# Patient Record
Sex: Female | Born: 1990 | Race: White | Hispanic: No | Marital: Single | Smoking: Never smoker
Health system: Southern US, Community
[De-identification: ages and names within clinical notes are randomized; demographics above are authoritative.]

## PROBLEM LIST (undated history)

## (undated) DIAGNOSIS — F329 Major depressive disorder, single episode, unspecified: Secondary | ICD-10-CM

## (undated) DIAGNOSIS — J45909 Unspecified asthma, uncomplicated: Secondary | ICD-10-CM

## (undated) DIAGNOSIS — F32A Depression, unspecified: Secondary | ICD-10-CM

## (undated) HISTORY — PX: NO PAST SURGERIES: SHX2092

---

## 2015-12-31 ENCOUNTER — Observation Stay
Admission: EM | Admit: 2015-12-31 | Discharge: 2016-01-02 | Disposition: A | Discharge status: Home / Self Care | Payer: BLUE CROSS/BLUE SHIELD | Attending: Internal Medicine | Admitting: Internal Medicine

## 2015-12-31 ENCOUNTER — Emergency Department: Payer: BLUE CROSS/BLUE SHIELD

## 2015-12-31 DIAGNOSIS — F329 Major depressive disorder, single episode, unspecified: Secondary | ICD-10-CM | POA: Diagnosis not present

## 2015-12-31 DIAGNOSIS — Z79899 Other long term (current) drug therapy: Secondary | ICD-10-CM | POA: Diagnosis not present

## 2015-12-31 DIAGNOSIS — J40 Bronchitis, not specified as acute or chronic: Secondary | ICD-10-CM | POA: Diagnosis present

## 2015-12-31 DIAGNOSIS — J45909 Unspecified asthma, uncomplicated: Secondary | ICD-10-CM | POA: Diagnosis not present

## 2015-12-31 DIAGNOSIS — J069 Acute upper respiratory infection, unspecified: Secondary | ICD-10-CM | POA: Diagnosis present

## 2015-12-31 DIAGNOSIS — J209 Acute bronchitis, unspecified: Secondary | ICD-10-CM | POA: Diagnosis not present

## 2015-12-31 DIAGNOSIS — E872 Acidosis, unspecified: Secondary | ICD-10-CM

## 2015-12-31 DIAGNOSIS — F32A Depression, unspecified: Secondary | ICD-10-CM | POA: Diagnosis present

## 2015-12-31 HISTORY — DX: Depression, unspecified: F32.A

## 2015-12-31 HISTORY — DX: Unspecified asthma, uncomplicated: J45.909

## 2015-12-31 HISTORY — DX: Major depressive disorder, single episode, unspecified: F32.9

## 2015-12-31 LAB — CBC
HCT: 43.1 % (ref 35.0–47.0)
HEMOGLOBIN: 15 g/dL (ref 12.0–16.0)
MCH: 32.2 pg (ref 26.0–34.0)
MCHC: 34.8 g/dL (ref 32.0–36.0)
MCV: 92.6 fL (ref 80.0–100.0)
PLATELETS: 214 10*3/uL (ref 150–440)
RBC: 4.66 MIL/uL (ref 3.80–5.20)
RDW: 13 % (ref 11.5–14.5)
WBC: 8 10*3/uL (ref 3.6–11.0)

## 2015-12-31 LAB — BASIC METABOLIC PANEL
ANION GAP: 10 (ref 5–15)
BUN: 7 mg/dL (ref 6–20)
CHLORIDE: 111 mmol/L (ref 101–111)
CO2: 19 mmol/L — ABNORMAL LOW (ref 22–32)
Calcium: 8.5 mg/dL — ABNORMAL LOW (ref 8.9–10.3)
Creatinine, Ser: 0.64 mg/dL (ref 0.44–1.00)
GFR calc Af Amer: >60 mL/min (ref >60)
GLUCOSE: 93 mg/dL (ref 65–99)
POTASSIUM: 3.9 mmol/L (ref 3.5–5.1)
Sodium: 140 mmol/L (ref 135–145)

## 2015-12-31 LAB — RAPID INFLUENZA A&B ANTIGENS (ARMC ONLY): INFLUENZA A (ARMC): NOT DETECTED

## 2015-12-31 LAB — RAPID INFLUENZA A&B ANTIGENS: Influenza B (ARMC): NOT DETECTED

## 2015-12-31 LAB — LACTIC ACID, PLASMA: Lactic Acid, Venous: 1.7 mmol/L (ref 0.5–2.0)

## 2015-12-31 MED ORDER — SODIUM CHLORIDE 0.9 % IV BOLUS (SEPSIS)
1000.0000 mL | Freq: Once | INTRAVENOUS | Status: AC
  Administered 2015-12-31: 1000 mL via INTRAVENOUS

## 2015-12-31 MED ORDER — METHYLPREDNISOLONE SODIUM SUCC 125 MG IJ SOLR
INTRAMUSCULAR | Status: AC
Start: 2015-12-31 — End: 2015-12-31
  Administered 2015-12-31: 125 mg
  Filled 2015-12-31: qty 2

## 2015-12-31 MED ORDER — ALBUTEROL SULFATE (2.5 MG/3ML) 0.083% IN NEBU
INHALATION_SOLUTION | RESPIRATORY_TRACT | Status: AC
  Administered 2015-12-31: 2.5 mg
  Filled 2015-12-31: qty 3

## 2015-12-31 MED ORDER — MAGNESIUM SULFATE 2 GM/50ML IV SOLN
2.0000 g | Freq: Once | INTRAVENOUS | Status: AC
  Administered 2016-01-01: 2 g via INTRAVENOUS
  Filled 2015-12-31: qty 50

## 2015-12-31 MED ORDER — IPRATROPIUM-ALBUTEROL 0.5-2.5 (3) MG/3ML IN SOLN
3.0000 mL | Freq: Once | RESPIRATORY_TRACT | Status: AC
  Administered 2015-12-31: 3 mL via RESPIRATORY_TRACT
  Filled 2015-12-31: qty 3

## 2015-12-31 MED ORDER — ALBUTEROL SULFATE (2.5 MG/3ML) 0.083% IN NEBU
7.5000 mg/h | INHALATION_SOLUTION | RESPIRATORY_TRACT | Status: AC
  Administered 2015-12-31: 7.5 mg/h via RESPIRATORY_TRACT
  Filled 2015-12-31 (×2): qty 3

## 2015-12-31 NOTE — ED Provider Notes (Signed)
[facility] Emergency Department Provider Note  ____________________________________________  Time seen: Approximately 11:12 PM  I have reviewed the triage vital signs and the nursing notes.   HISTORY  Chief Complaint Cough    HPI Leslie Price is a 25 y.o. female reports no previous medical history.  Patient reports that she started developing cough cold and runny nose yesterday. She had some slight wheezing, and then this evening had felt like she ate a "inhaler" and her wheezing continued to worsen. She feels short of breath, notices a tightening across her chest as though she is having trouble breathing.  Denies abdominal pain. No sharp pain. Reports feeling very short of breath and anxious. She does not have any fever that she is aware of it is felt like she's been running a fever.  She did not receive influenza vaccine this year.   No past medical history on file.  There are no active problems to display for this patient.   No past surgical history on file.  Current Outpatient Rx  Name  Route  Sig  Dispense  Refill  . escitalopram (LEXAPRO) 10 MG tablet   Oral   Take 10 mg by mouth daily.           Allergies Review of patient's allergies indicates no known allergies.  No family history on file.  Social History Social History  Substance Use Topics  . Smoking status: Not on file  . Smokeless tobacco: Not on file  . Alcohol Use: Not on file   Denies smokeless tobacco use Denies alcohol use tonight  Review of Systems Constitutional: Feeling warm and chills Eyes: No visual changes. ENT: No sore throat. Cardiovascular: Denies chest pain feels tight. Respiratory: See history of present illness Gastrointestinal: No abdominal pain.  No nausea, no vomiting.  No diarrhea.  No constipation. Genitourinary: Negative for dysuria. Musculoskeletal: Negative for back pain. Skin: Negative for rash. Neurological: Negative for headaches,  focal weakness or numbness.  10-point ROS otherwise negative.  ____________________________________________   PHYSICAL EXAM:  VITAL SIGNS: ED Triage Vitals  Enc Vitals Group     BP 12/31/15 2214 120/87 mmHg     Pulse Rate 12/31/15 2214 127     Resp 12/31/15 2214 24     Temp 12/31/15 2214 98.5 F (36.9 C)     Temp Source 12/31/15 2214 Oral     SpO2 12/31/15 2214 98 %     Weight 12/31/15 2214 140 lb (63.504 kg)     Height 12/31/15 2214 5\' 1"  (1.549 m)     Head Cir --      Peak Flow --      Pain Score 12/31/15 2215 8     Pain Loc --      Pain Edu? --      Excl. in GC? --    Constitutional: Alert and oriented and moderately anxious. Diaphoretic Eyes: Conjunctivae are normal. PERRL. EOMI. Head: Atraumatic. Nose: No congestion/rhinnorhea. Mouth/Throat: Mucous membranes are moist.  Neck: No stridor.   Cardiovascular: Tachycardic rate, regular rhythm. Grossly normal heart sounds.  Good peripheral circulation. Respiratory: Moderate increased work of breathing, diffuse end expiratory wheezing and frequent dry cough are noted. Patient is able to speak in phrases, but does not form. Sentences and does appear dyspneic. No rales are noted. No rhonchi. Gastrointestinal: Soft and nontender. No distention. Patient denies pregnancy. Abdomen does not appear gravid Musculoskeletal: No lower extremity tenderness nor edema.  No joint effusions. Neurologic:  Normal speech and language.  No gross focal neurologic deficits are appreciated. Skin:  Skin is warm, dry and intact. No rash noted. Psychiatric: Mood and affect are slightly anxious.  ____________________________________________   LABS (all labs ordered are listed, but only abnormal results are displayed)  Labs Reviewed  BLOOD GAS, VENOUS - Abnormal; Notable for the following:    pH, Ven 7.50 (*)    pCO2, Ven 26 (*)    Bicarbonate 20.3 (*)    All other components within normal limits  BASIC METABOLIC PANEL - Abnormal; Notable for the  following:    CO2 19 (*)    Calcium 8.5 (*)    All other components within normal limits  RAPID INFLUENZA A&B ANTIGENS ([facility] ONLY)  LACTIC ACID, PLASMA  CBC  LACTIC ACID, PLASMA  HCG, QUANTITATIVE, PREGNANCY   ____________________________________________  EKG  Reviewed and interpreted by me at 2230 Heart rate 1:30 QRS 100 QTc 440 PR 140 Sinus tachycardia, rate approximately 1:30. Mild artifact is noted, however no acute ischemic abnormality noted ____________________________________________  RADIOLOGY  DG Chest Portable 1 View (Final result) Result time: 12/31/15 23:37:24   Final result by Rad Results In Interface (12/31/15 23:37:24)   Narrative:   CLINICAL DATA: Acute onset of cough and wheezing. Initial encounter.  EXAM: PORTABLE CHEST 1 VIEW  COMPARISON: None.  FINDINGS: The lungs are well-aerated and clear. There is no evidence of focal opacification, pleural effusion or pneumothorax.  The cardiomediastinal silhouette is within normal limits. No acute osseous abnormalities are seen.  IMPRESSION: No acute cardiopulmonary process seen.   Electronically Signed By: Roanna Raider M.D.    ____________________________________________   PROCEDURES  Procedure(s) performed: None  Critical Care performed: Yes, see critical care note(s)  CRITICAL CARE Performed by: Sharyn Creamer   Total critical care time: 35 minutes  Critical care time was exclusive of separately billable procedures and treating other patients.  Critical care was necessary to treat or prevent imminent or life-threatening deterioration.  Critical care was time spent personally by me on the following activities: development of treatment plan with patient and/or surrogate as well as nursing, discussions with consultants, evaluation of patient's response to treatment, examination of patient, obtaining history from patient or surrogate, ordering and performing treatments and  interventions, ordering and review of laboratory studies, ordering and review of radiographic studies, pulse oximetry and re-evaluation of patient's condition.  ____________________________________________   INITIAL IMPRESSION / ASSESSMENT AND PLAN / ED COURSE  Pertinent labs & imaging results that were available during my care of the patient were reviewed by me and considered in my medical decision making (see chart for details).  Patient presents with evidence of clinical severe bronchospasm. Increased work of breathing, and this seems to occur in the setting of a recent upper respiratory type symptoms. Her influenza test is negative and chest x-ray is clear. She is afebrile and her white count is normal at this time, with no suggestion of associated pneumonia. EKG reassuring, and we will treat her aggressively with treatment for bronchospasm and she clearly demonstrates this. In addition her end-tidal CO2 is are showing waveforms which are "shark fan" in appearance and consistent with bronchospasm.    ----------------------------------------- 11:52 PM on 12/31/2015 -----------------------------------------  Patient reports she is feeling much improved. She is respiratory more comfortably, and her lungs show only mild end expiratory wheezing at this time. Respiratory rate is improved, she is fully alert and appears clinically to be much improving. We'll continue to follow her closely, and given the extent of her symptoms including  need for continuous nebulizers after multiple previous DuoNeb abs I will initiate magnesium infusion for bronchospasm. Dr. Zenda Alpers will follow-up closely with reevaluation's and reexam. The patient may be a candidate for discharge versus admission based on ongoing observation in the ER and response ongoing therapy and she is clearly demonstrating a positive trend with good clinical signs of improvement at this  time. ____________________________________________   FINAL CLINICAL IMPRESSION(S) / ED DIAGNOSES  Final diagnoses:  Bronchitis with bronchospasm      Sharyn Creamer, MD 12/31/15 2358

## 2015-12-31 NOTE — ED Notes (Signed)
Pt started with cough yesterday, states that she feels like she can't breathe, pt has pressured expiratory respirations with wheezing noted, pt denies asthma hx, pt is crying and stating that she can't breathe

## 2016-01-01 ENCOUNTER — Encounter: Payer: Self-pay | Admitting: Internal Medicine

## 2016-01-01 DIAGNOSIS — F32A Depression, unspecified: Secondary | ICD-10-CM | POA: Diagnosis present

## 2016-01-01 DIAGNOSIS — F329 Major depressive disorder, single episode, unspecified: Secondary | ICD-10-CM | POA: Diagnosis present

## 2016-01-01 DIAGNOSIS — E872 Acidosis, unspecified: Secondary | ICD-10-CM

## 2016-01-01 DIAGNOSIS — J45909 Unspecified asthma, uncomplicated: Secondary | ICD-10-CM | POA: Insufficient documentation

## 2016-01-01 DIAGNOSIS — J069 Acute upper respiratory infection, unspecified: Secondary | ICD-10-CM | POA: Diagnosis present

## 2016-01-01 LAB — CBC WITH DIFFERENTIAL/PLATELET
BASOS ABS: 0.1 10*3/uL (ref 0–0.1)
BASOS PCT: 1 %
EOS ABS: 0.1 10*3/uL (ref 0–0.7)
Eosinophils Relative: 1 %
HEMATOCRIT: 37.3 % (ref 35.0–47.0)
Hemoglobin: 13.1 g/dL (ref 12.0–16.0)
Lymphocytes Relative: 4 %
Lymphs Abs: 0.4 10*3/uL — ABNORMAL LOW (ref 1.0–3.6)
MCH: 32.4 pg (ref 26.0–34.0)
MCHC: 35.1 g/dL (ref 32.0–36.0)
MCV: 92.1 fL (ref 80.0–100.0)
MONO ABS: 0.1 10*3/uL — AB (ref 0.2–0.9)
MONOS PCT: 1 %
NEUTROS ABS: 9.4 10*3/uL — AB (ref 1.4–6.5)
NEUTROS PCT: 93 %
Platelets: 177 10*3/uL (ref 150–440)
RBC: 4.06 MIL/uL (ref 3.80–5.20)
RDW: 13.1 % (ref 11.5–14.5)
WBC: 10 10*3/uL (ref 3.6–11.0)

## 2016-01-01 LAB — COMPREHENSIVE METABOLIC PANEL
ALT: 17 U/L (ref 14–54)
AST: 29 U/L (ref 15–41)
Albumin: 3.5 g/dL (ref 3.5–5.0)
Alkaline Phosphatase: 80 U/L (ref 38–126)
Anion gap: 15 (ref 5–15)
BUN: 5 mg/dL — AB (ref 6–20)
CHLORIDE: 110 mmol/L (ref 101–111)
CO2: 14 mmol/L — ABNORMAL LOW (ref 22–32)
Calcium: 7.5 mg/dL — ABNORMAL LOW (ref 8.9–10.3)
Creatinine, Ser: 0.64 mg/dL (ref 0.44–1.00)
Glucose, Bld: 147 mg/dL — ABNORMAL HIGH (ref 65–99)
POTASSIUM: 2.8 mmol/L — AB (ref 3.5–5.1)
SODIUM: 139 mmol/L (ref 135–145)
Total Bilirubin: 0.4 mg/dL (ref 0.3–1.2)
Total Protein: 6.8 g/dL (ref 6.5–8.1)

## 2016-01-01 LAB — BASIC METABOLIC PANEL
ANION GAP: 17 — AB (ref 5–15)
BUN: 5 mg/dL — ABNORMAL LOW (ref 6–20)
CALCIUM: 7.5 mg/dL — AB (ref 8.9–10.3)
CO2: 12 mmol/L — ABNORMAL LOW (ref 22–32)
CREATININE: 0.68 mg/dL (ref 0.44–1.00)
Chloride: 110 mmol/L (ref 101–111)
GLUCOSE: 200 mg/dL — AB (ref 65–99)
Potassium: 3.1 mmol/L — ABNORMAL LOW (ref 3.5–5.1)
Sodium: 139 mmol/L (ref 135–145)

## 2016-01-01 LAB — BLOOD GAS, ARTERIAL
ACID-BASE DEFICIT: 8.6 mmol/L — AB (ref 0.0–2.0)
Bicarbonate: 13.6 mEq/L — ABNORMAL LOW (ref 21.0–28.0)
O2 Saturation: 98.5 %
Patient temperature: 37
pCO2 arterial: 21 mmHg — ABNORMAL LOW (ref 32.0–48.0)
pH, Arterial: 7.42 (ref 7.350–7.450)
pO2, Arterial: 114 mmHg — ABNORMAL HIGH (ref 83.0–108.0)

## 2016-01-01 LAB — BLOOD GAS, VENOUS
Acid-base deficit: 1.4 mmol/L (ref 0.0–2.0)
BICARBONATE: 20.3 meq/L — AB (ref 21.0–28.0)
O2 Saturation: 89.7 %
Patient temperature: 37
pCO2, Ven: 26 mmHg — ABNORMAL LOW (ref 44.0–60.0)
pH, Ven: 7.5 — ABNORMAL HIGH (ref 7.320–7.430)
pO2, Ven: <31 mmHg (ref 30.0–45.0)

## 2016-01-01 LAB — LACTIC ACID, PLASMA
LACTIC ACID, VENOUS: 6.6 mmol/L — AB (ref 0.5–2.0)
LACTIC ACID, VENOUS: 3.3 mmol/L — AB (ref 0.5–2.0)
Lactic Acid, Venous: 5.2 mmol/L (ref 0.5–2.0)
Lactic Acid, Venous: 6.2 mmol/L (ref 0.5–2.0)
Lactic Acid, Venous: 1.4 mmol/L (ref 0.5–2.0)

## 2016-01-01 LAB — HCG, QUANTITATIVE, PREGNANCY: hCG, Beta Chain, Quant, S: <1 m[IU]/mL (ref <5)

## 2016-01-01 LAB — MAGNESIUM: Magnesium: 1.3 mg/dL — ABNORMAL LOW (ref 1.7–2.4)

## 2016-01-01 LAB — MRSA PCR SCREENING: MRSA by PCR: NEGATIVE

## 2016-01-01 MED ORDER — SODIUM CHLORIDE 0.9 % IV BOLUS (SEPSIS)
1000.0000 mL | Freq: Once | INTRAVENOUS | Status: AC
  Administered 2016-01-01: 12:00:00 1000 mL via INTRAVENOUS

## 2016-01-01 MED ORDER — PIPERACILLIN-TAZOBACTAM 3.375 G IVPB 30 MIN
3.3750 g | Freq: Three times a day (TID) | INTRAVENOUS | Status: DC
  Administered 2016-01-01: 3.375 g via INTRAVENOUS
  Filled 2016-01-01 (×2): qty 50

## 2016-01-01 MED ORDER — ACETAMINOPHEN 650 MG RE SUPP: 650.0000 mg | Freq: Four times a day (QID) | RECTAL | Status: DC | PRN

## 2016-01-01 MED ORDER — ONDANSETRON HCL 4 MG/2ML IJ SOLN: 4.0000 mg | Freq: Four times a day (QID) | INTRAMUSCULAR | Status: DC | PRN

## 2016-01-01 MED ORDER — MAGNESIUM SULFATE 4 GM/100ML IV SOLN
4.0000 g | Freq: Once | INTRAVENOUS | Status: AC
  Administered 2016-01-01: 4 g via INTRAVENOUS
  Filled 2016-01-01: qty 100

## 2016-01-01 MED ORDER — VANCOMYCIN HCL IN DEXTROSE 1-5 GM/200ML-% IV SOLN
1000.0000 mg | INTRAVENOUS | Status: DC
  Administered 2016-01-01: 1000 mg via INTRAVENOUS
  Filled 2016-01-01: qty 200

## 2016-01-01 MED ORDER — ACETAMINOPHEN 325 MG PO TABS: 650.0000 mg | ORAL_TABLET | Freq: Four times a day (QID) | ORAL | Status: DC | PRN

## 2016-01-01 MED ORDER — ONDANSETRON HCL 4 MG PO TABS: 4.0000 mg | ORAL_TABLET | Freq: Four times a day (QID) | ORAL | Status: DC | PRN

## 2016-01-01 MED ORDER — SODIUM CHLORIDE 0.9% FLUSH
3.0000 mL | Freq: Two times a day (BID) | INTRAVENOUS | Status: DC
  Administered 2016-01-01 – 2016-01-02 (×3): 3 mL via INTRAVENOUS

## 2016-01-01 MED ORDER — SODIUM CHLORIDE 0.9 % IV BOLUS (SEPSIS)
1000.0000 mL | Freq: Once | INTRAVENOUS | Status: AC
  Administered 2016-01-01: 1000 mL via INTRAVENOUS

## 2016-01-01 MED ORDER — IPRATROPIUM BROMIDE 0.02 % IN SOLN: 0.2500 mg | RESPIRATORY_TRACT | Status: DC | PRN

## 2016-01-01 MED ORDER — LORAZEPAM 2 MG/ML IJ SOLN
1.0000 mg | Freq: Once | INTRAMUSCULAR | Status: AC
  Administered 2016-01-01: 1 mg via INTRAVENOUS

## 2016-01-01 MED ORDER — IPRATROPIUM BROMIDE 0.02 % IN SOLN
0.5000 mg | RESPIRATORY_TRACT | Status: DC
  Administered 2016-01-02 (×4): 0.5 mg via RESPIRATORY_TRACT
  Filled 2016-01-01 (×4): qty 2.5

## 2016-01-01 MED ORDER — POTASSIUM CHLORIDE CRYS ER 20 MEQ PO TBCR
40.0000 meq | EXTENDED_RELEASE_TABLET | ORAL | Status: DC
  Administered 2015-12-31 – 2016-01-01 (×2): 40 meq via ORAL
  Filled 2016-01-01: qty 2

## 2016-01-01 MED ORDER — THIAMINE HCL 100 MG/ML IJ SOLN
500.0000 mg | Freq: Once | INTRAVENOUS | Status: AC
  Administered 2016-01-01: 500 mg via INTRAVENOUS
  Filled 2016-01-01: qty 5

## 2016-01-01 MED ORDER — ESCITALOPRAM OXALATE 10 MG PO TABS
10.0000 mg | ORAL_TABLET | Freq: Every day | ORAL | Status: DC
  Administered 2016-01-01 – 2016-01-02 (×2): 10 mg via ORAL
  Filled 2016-01-01 (×2): qty 1

## 2016-01-01 MED ORDER — PIPERACILLIN-TAZOBACTAM 3.375 G IVPB
3.3750 g | Freq: Three times a day (TID) | INTRAVENOUS | Status: DC
  Administered 2016-01-01: 3.375 g via INTRAVENOUS
  Filled 2016-01-01 (×3): qty 50

## 2016-01-01 MED ORDER — ENOXAPARIN SODIUM 40 MG/0.4ML ~~LOC~~ SOLN
40.0000 mg | SUBCUTANEOUS | Status: DC
  Administered 2016-01-01: 40 mg via SUBCUTANEOUS
  Filled 2016-01-01: qty 0.4

## 2016-01-01 MED ORDER — THIAMINE HCL 100 MG/ML IJ SOLN
100.0000 mg | Freq: Every day | INTRAMUSCULAR | Status: DC
  Administered 2016-01-01: 100 mg via INTRAVENOUS
  Filled 2016-01-01: qty 2

## 2016-01-01 MED ORDER — IPRATROPIUM BROMIDE 0.02 % IN SOLN
0.5000 mg | RESPIRATORY_TRACT | Status: DC | PRN
Start: 2016-01-01 — End: 2016-01-02

## 2016-01-01 MED ORDER — IPRATROPIUM BROMIDE 0.02 % IN SOLN
0.2500 mg | Freq: Four times a day (QID) | RESPIRATORY_TRACT | Status: DC
  Administered 2016-01-01 (×2): 0.25 mg via RESPIRATORY_TRACT
  Filled 2016-01-01 (×2): qty 2.5

## 2016-01-01 MED ORDER — POTASSIUM CHLORIDE CRYS ER 20 MEQ PO TBCR
40.0000 meq | EXTENDED_RELEASE_TABLET | Freq: Once | ORAL | Status: AC
  Administered 2016-01-01: 40 meq via ORAL
  Filled 2016-01-01: qty 2

## 2016-01-01 MED ORDER — SODIUM CHLORIDE 0.9 % IV SOLN
INTRAVENOUS | Status: DC
Start: 2016-01-01 — End: 2016-01-02
  Administered 2016-01-01 (×4): via INTRAVENOUS

## 2016-01-01 MED ORDER — IPRATROPIUM-ALBUTEROL 0.5-2.5 (3) MG/3ML IN SOLN
3.0000 mL | RESPIRATORY_TRACT | Status: DC
  Administered 2016-01-01 (×3): 3 mL via RESPIRATORY_TRACT
  Filled 2016-01-01 (×3): qty 3

## 2016-01-01 MED ORDER — LORAZEPAM 2 MG/ML IJ SOLN
INTRAMUSCULAR | Status: AC
  Administered 2016-01-01: 1 mg via INTRAVENOUS
  Filled 2016-01-01: qty 1

## 2016-01-01 MED ORDER — VANCOMYCIN HCL IN DEXTROSE 1-5 GM/200ML-% IV SOLN
1000.0000 mg | Freq: Two times a day (BID) | INTRAVENOUS | Status: DC
  Administered 2016-01-01: 1000 mg via INTRAVENOUS
  Filled 2016-01-01 (×2): qty 200

## 2016-01-01 MED ORDER — VITAMIN B-1 100 MG PO TABS: 100.0000 mg | ORAL_TABLET | Freq: Every day | ORAL | Status: DC

## 2016-01-01 MED ORDER — METHYLPREDNISOLONE SODIUM SUCC 125 MG IJ SOLR
60.0000 mg | Freq: Two times a day (BID) | INTRAMUSCULAR | Status: DC
  Administered 2016-01-01 – 2016-01-02 (×3): 60 mg via INTRAVENOUS
  Filled 2016-01-01 (×3): qty 2

## 2016-01-01 NOTE — Progress Notes (Signed)
Pt SOB/tachypnea improved after lorazepam administration. Sleeping in room. VSS. Uses urinal. Family has been at bedside and supportive.

## 2016-01-01 NOTE — Progress Notes (Signed)
CONCERNING: IV to Oral Route Change Policy  RECOMMENDATION: This patient is receiving thiamine by the intravenous route.  Based on criteria approved by the Pharmacy and Therapeutics Committee, the intravenous medication(s) is/are being converted to the equivalent oral dose form(s).   DESCRIPTION: These criteria include:  The patient is eating (either orally or via tube) and/or has been taking other orally administered medications for a least 24 hours  The patient has no evidence of active gastrointestinal bleeding or impaired GI absorption (gastrectomy, short bowel, patient on TNA or NPO).  If you have questions about this conversion, please contact the Pharmacy Department    [phone] )  Jeani Hawking   [phone] )  [facility]   [phone] )  [facility]   [phone] )  [facility]   [phone] )  [facility]   [place], [facility] 01/01/2016 10:08 AM

## 2016-01-01 NOTE — Progress Notes (Signed)
Suspect this patient had albuterol induced lactic acidosis given no other indication for sepsis or hypoperfusion.  Enhanced ?2 receptor activation leads to increased glycogenolysis, gluconeogenesis, lipolysis and ultimately to increased conversion of pyruvate to lactic acid. Concurrent corticosteroid use may enhance the beta receptor sensitivity further potentiating the lactic acidosis.  Chest. 2011;140(4_MeetingAbstracts):183A. Doi:[id number]/chest.[id number]  Will avoid further albuterol use for this patient.  Ordered Atrovent only nebs.  Kristeen Miss [facility] 01/01/2016, 8:33 PM

## 2016-01-01 NOTE — ED Provider Notes (Signed)
-----------------------------------------   2:24 AM on 01/01/2016 -----------------------------------------   Blood pressure 141/97, pulse 107, temperature 98.5 F (36.9 C), temperature source Oral, resp. rate 26, height  (1.549 m), weight 140 lb (63.504 kg), last menstrual period 12/10/2015, SpO2 99 %.  Assuming care from Dr. Fanny Bien.  In short, Leslie Price is a 25 y.o. female with a chief complaint of Cough .  Refer to the original H&P for additional details.  The current plan of care is to reassess the patient.   The patient had a repeat lactic acid drawn which was sent in from previously. Her repeat lactic acid came back at 5.2. She was receiving a second liter of normal saline as well as her magnesium sulfate. When assessed the patient reports that she feels better but she does continue to have some significant wheezing. We will redraw the lactic acid to ensure it is accurate given the patient's previous lactic acid of 1.7 but given her continued wheezing and tachycardia we will admit the patient to the hospitalist service. I discussed this with the patient as well as the hospitalist. She will also receive blood cultures drawn.  Rebecka Apley, MD 01/01/16 [phone]

## 2016-01-01 NOTE — ED Notes (Signed)
Per med surge pt was requested to go to a different unit d/t high level of isolation rooms on their unit. Flue swab was negative in ER so droplet precautions have been dc's per verbal order form Dr. Anne Hahn. [facility] notified of change.

## 2016-01-01 NOTE — Progress Notes (Signed)
Pharmacy Antibiotic Note  Aradia Estey is a 25 y.o. female admitted on 12/31/2015 with sepsis.  Pharmacy has been consulted for vancomycin and Zosyn dosing.  Plan: DW 54kg  Vd 38L kei 0.083 hr-1  T1/2 9 hours. Vancomycin 1 gram q 12 hours ordered with stacked dosing. Level before 5th dose. Goal 15-20  Zosyn 3.375 grams q 8 hours ordered by MD. OK for pt parameters.  Height:  (154.9 cm) Weight: 140 lb (63.504 kg) IBW/kg (Calculated) : 47.8  Temp (24hrs), Avg:98.5 F (36.9 C), Min:98.5 F (36.9 C), Max:98.5 F (36.9 C)   Recent Labs Lab 12/31/15 2233 01/01/16 0030 01/01/16 0147 01/01/16 0241  WBC 8.0  --   --   --   CREATININE 0.64  --   --  0.64  LATICACIDVEN 1.7 5.2* 6.2*  --     Estimated Creatinine Clearance: 94.2 mL/min (by C-G formula based on Cr of 0.64).    No Known Allergies  Antimicrobials this admission:   >>    >>   Dose adjustments this admission:   Microbiology results: 2/27 BCx: pending   2/27 CXR: no acute disease  Thank you for allowing pharmacy to be a part of this patient's care.  Aylen Stradford S 01/01/2016 3:31 AM

## 2016-01-01 NOTE — Progress Notes (Signed)
Notified Dr. Sheryle Hail of Lactic of 6.6 and K 3.1. No new orders regarding lactic acid levels. He will place orders to replace K. Will continue to monitor.

## 2016-01-01 NOTE — H&P (Addendum)
[facility] [place] at [facility]   PATIENT NAME: Leslie Price    MR#:  [id number]  DATE OF BIRTH:  [birth year]  DATE OF ADMISSION:  12/31/2015  PRIMARY CARE PHYSICIAN: No primary care provider on file.   REQUESTING/REFERRING PHYSICIAN: Zenda Alpers, MD  CHIEF COMPLAINT:   Chief Complaint  Patient presents with  . Cough    HISTORY OF PRESENT ILLNESS:  Leslie Price  is a 25 y.o. female who presents with cough and shortness of breath. Patient states that several days ago she began having symptoms of an upper respiratory infection. She started having a runny nose and congestion as well as subsequent development of a cough. She says that she had some episodes of chills at home. However, she says that today she became acutely short of breath with onset of significant wheezing. This happened 2 separate times today and the second time she decided to come to ED for evaluation. Here she was treated with multiple rounds of duo nebs and IV magnesium with some improvement of her symptoms, but she still has significant wheezing and is unable to speak in complete sentences. For this reason hospitalists were called for admission.  PAST MEDICAL HISTORY:   Past Medical History  Diagnosis Date  . Asthma   . Depression     PAST SURGICAL HISTORY:   Past Surgical History  Procedure Laterality Date  . No past surgeries      SOCIAL HISTORY:   Social History  Substance Use Topics  . Smoking status: Never Smoker   . Smokeless tobacco: Not on file  . Alcohol Use: No    FAMILY HISTORY:  No family history on file.  DRUG ALLERGIES:  No Known Allergies  MEDICATIONS AT HOME:   Prior to Admission medications   Medication Sig Start Date End Date Taking? Authorizing Provider  escitalopram (LEXAPRO) 10 MG tablet Take 10 mg by mouth daily.   Yes Historical Provider, MD    REVIEW OF SYSTEMS:  Review of Systems  Constitutional: Positive for chills and malaise/fatigue.  Negative for fever and weight loss.  HENT: Negative for ear pain, hearing loss and tinnitus.   Eyes: Negative for blurred vision, double vision, pain and redness.  Respiratory: Positive for cough, shortness of breath and wheezing. Negative for hemoptysis.   Cardiovascular: Negative for chest pain, palpitations, orthopnea and leg swelling.  Gastrointestinal: Negative for nausea, vomiting, abdominal pain, diarrhea and constipation.  Genitourinary: Negative for dysuria, frequency and hematuria.  Musculoskeletal: Negative for back pain, joint pain and neck pain.  Skin:       No acne, rash, or lesions  Neurological: Negative for dizziness, tremors, focal weakness and weakness.  Endo/Heme/Allergies: Negative for polydipsia. Does not bruise/bleed easily.  Psychiatric/Behavioral: Negative for depression. The patient is not nervous/anxious and does not have insomnia.      VITAL SIGNS:   Filed Vitals:   12/31/15 2230 12/31/15 2330 01/01/16 0000 01/01/16 0100  BP: 151/98 138/89 155/133 141/97  Pulse: 128 106 124 107  Temp:      TempSrc:      Resp: 26 22 23 26   Height:      Weight:      SpO2: 99% 100% 100% 99%   Wt Readings from Last 3 Encounters:  12/31/15 63.504 kg (140 lb)    PHYSICAL EXAMINATION:  Physical Exam  Vitals reviewed. Constitutional: She is oriented to person, place, and time. She appears well-developed and well-nourished. No distress.  HENT:  Head: Normocephalic and atraumatic.  Mouth/Throat: Oropharynx is clear and moist.  Eyes: Conjunctivae and EOM are normal. Pupils are equal, round, and reactive to light. No scleral icterus.  Neck: Normal range of motion. Neck supple. No JVD present. No thyromegaly present.  Cardiovascular: Regular rhythm and intact distal pulses.  Exam reveals no gallop and no friction rub.   No murmur heard. tachycardic  Respiratory: She is in respiratory distress. She has wheezes. She has no rales.  GI: Soft. Bowel sounds are normal. She  exhibits no distension. There is no tenderness.  Musculoskeletal: Normal range of motion. She exhibits no edema.  No arthritis, no gout  Lymphadenopathy:    She has no cervical adenopathy.  Neurological: She is alert and oriented to person, place, and time. No cranial nerve deficit.  No dysarthria, no aphasia  Skin: Skin is warm and dry. No rash noted. No erythema.  Psychiatric: She has a normal mood and affect. Her behavior is normal. Judgment and thought content normal.    LABORATORY PANEL:   CBC  Recent Labs Lab 12/31/15 2233  WBC 8.0  HGB 15.0  HCT 43.1  PLT 214   ------------------------------------------------------------------------------------------------------------------  Chemistries   Recent Labs Lab 12/31/15 2233  NA 140  K 3.9  CL 111  CO2 19*  GLUCOSE 93  BUN 7  CREATININE 0.64  CALCIUM 8.5*   ------------------------------------------------------------------------------------------------------------------  Cardiac Enzymes No results for input(s): TROPONINI in the last 168 hours. ------------------------------------------------------------------------------------------------------------------  RADIOLOGY:  Dg Chest Portable 1 View  12/31/2015  CLINICAL DATA:  Acute onset of cough and wheezing. Initial encounter. EXAM: PORTABLE CHEST 1 VIEW COMPARISON:  None. FINDINGS: The lungs are well-aerated and clear. There is no evidence of focal opacification, pleural effusion or pneumothorax. The cardiomediastinal silhouette is within normal limits. No acute osseous abnormalities are seen. IMPRESSION: No acute cardiopulmonary process seen. Electronically Signed   By: Roanna Raider M.D.   On: 12/31/2015 23:37    EKG:   Orders placed or performed during the hospital encounter of 12/31/15  . ED EKG  . ED EKG  . EKG 12-Lead  . EKG 12-Lead    IMPRESSION AND PLAN:  Principal Problem:   Reactive airway disease - patient has no prior history of asthma, that  she has had URI for the past several days which could potentially exacerbate an asthma-like exacerbation. She has had some improvement with medications initially needed, we will admit her for observation for continued treatment with nebulizers and for monitoring for improvement. Get ABG. Active Problems:   Lactic acidosis - lactic acid was normal with first lab draw in the ED, on redraw it was elevated to 5, and on recheck went up even higher to 6. Unclear etiology up front. Aggressive fluid administration initiated, broad-spectrum antibiotics initiated, blood cultures had previously been sent. We'll continue to check serial lactic acids.   URI (upper respiratory infection) - continue symptomatic support   Depression - continue home med  All the records are reviewed and case discussed with ED provider. Management plans discussed with the patient and/or family.  DVT PROPHYLAXIS: SubQ lovenox  GI PROPHYLAXIS: None  ADMISSION STATUS: Observation  CODE STATUS: Full Code Status History    This patient does not have a recorded code status. Please follow your organizational policy for patients in this situation.      TOTAL CRITICAL CARE TIME TAKING CARE OF THIS PATIENT: 45 minutes.    Leslie Price 01/01/2016, 1:44 AM  [facility]  Office  [phone]  CC: Primary care  physician; No primary care provider on file.

## 2016-01-02 DIAGNOSIS — J209 Acute bronchitis, unspecified: Secondary | ICD-10-CM | POA: Diagnosis present

## 2016-01-02 LAB — BASIC METABOLIC PANEL
Anion gap: 6 (ref 5–15)
BUN: 6 mg/dL (ref 6–20)
CO2: 22 mmol/L (ref 22–32)
Calcium: 8.2 mg/dL — ABNORMAL LOW (ref 8.9–10.3)
Chloride: 110 mmol/L (ref 101–111)
Creatinine, Ser: 0.5 mg/dL (ref 0.44–1.00)
GFR calc Af Amer: >60 mL/min (ref >60)
GFR calc non Af Amer: >60 mL/min (ref >60)
Glucose, Bld: 150 mg/dL — ABNORMAL HIGH (ref 65–99)
Potassium: 3.8 mmol/L (ref 3.5–5.1)
Sodium: 138 mmol/L (ref 135–145)

## 2016-01-02 LAB — MAGNESIUM: Magnesium: 2.6 mg/dL — ABNORMAL HIGH (ref 1.7–2.4)

## 2016-01-02 MED ORDER — ALBUTEROL SULFATE HFA 108 (90 BASE) MCG/ACT IN AERS: 2.0000 | INHALATION_SPRAY | RESPIRATORY_TRACT | Status: DC | PRN

## 2016-01-02 MED ORDER — PREDNISONE 50 MG PO TABS: 50.0000 mg | ORAL_TABLET | Freq: Every day | ORAL | Status: DC

## 2016-01-02 MED ORDER — AZITHROMYCIN 500 MG PO TABS: 500.0000 mg | ORAL_TABLET | Freq: Every day | ORAL | Status: DC

## 2016-01-02 NOTE — Progress Notes (Signed)
Patient has O2 sats 98-100% with exertion. Patient has no complaints.

## 2016-01-02 NOTE — Discharge Instructions (Signed)

## 2016-01-02 NOTE — Progress Notes (Signed)
Patient discharged home per MD order. All discharge instructions given and all questions answered. 

## 2016-01-02 NOTE — Progress Notes (Signed)
[facility]         [place], [place].   01/02/2016  Patient: Leslie Price   Date of Birth:  1991-07-04  Date of admission:  12/31/2015  Date of Discharge  01/02/2016    To Whom it May Concern:   Leslie Price  may return to work on 01/04/2016.   If you have any questions or concerns, please don't hesitate to call.  Sincerely,   Milagros Loll R M.D Pager [phone] Office : [phone]   .

## 2016-01-04 NOTE — Discharge Summary (Signed)
[facility] - [place] at [facility]   PATIENT NAME: Leslie Price    MR#:  [id number]  DATE OF BIRTH:  February 05, 1991  DATE OF ADMISSION:  12/31/2015 ADMITTING PHYSICIAN: Oralia Manis, MD  DATE OF DISCHARGE: 01/02/2016 12:45 PM  PRIMARY CARE PHYSICIAN: No primary care provider on file.   ADMISSION DIAGNOSIS:  Bronchitis with bronchospasm [J40]  DISCHARGE DIAGNOSIS:  Active Problems:   Depression   Lactic acidosis   Acute bronchitis   SECONDARY DIAGNOSIS:   Past Medical History  Diagnosis Date  . Asthma   . Depression      ADMITTING HISTORY  Darlena Koval is a 25 y.o. female who presents with cough and shortness of breath. Patient states that several days ago she began having symptoms of an upper respiratory infection. She started having a runny nose and congestion as well as subsequent development of a cough. She says that she had some episodes of chills at home. However, she says that today she became acutely short of breath with onset of significant wheezing. This happened 2 separate times today and the second time she decided to come to ED for evaluation. Here she was treated with multiple rounds of duo nebs and IV magnesium with some improvement of her symptoms, but she still has significant wheezing and is unable to speak in complete sentences. For this reason hospitalists were called for admission.  HOSPITAL COURSE:   * Acute bronchitis with lactic acidosis She was treated with steroids, antibiotic and nebulizer therapy. Much improved by day of discharge. Ablated without shortness of breath. IV fluids for lactic acidosis. Repeat lactic acid was 1.4.  Patient stable for discharge home to follow-up with her primary care physician. Will be continued on oral prednisone and azithromycin. Albuterol inhaler prescribed.  CONSULTS OBTAINED:     DRUG ALLERGIES:  No Known Allergies  DISCHARGE MEDICATIONS:   Discharge Medication List as of  01/02/2016 11:41 AM    START taking these medications   Details  albuterol (PROVENTIL HFA;VENTOLIN HFA) 108 (90 Base) MCG/ACT inhaler Inhale 2 puffs into the lungs every 4 (four) hours as needed for wheezing or shortness of breath., Starting 01/02/2016, Until Discontinued, Normal    azithromycin (ZITHROMAX) 500 MG tablet Take 1 tablet (500 mg total) by mouth daily., Starting 01/02/2016, Until Discontinued, Normal    predniSONE (DELTASONE) 50 MG tablet Take 1 tablet (50 mg total) by mouth daily with breakfast., Starting 01/02/2016, Until Discontinued, Normal      CONTINUE these medications which have NOT CHANGED   Details  escitalopram (LEXAPRO) 10 MG tablet Take 10 mg by mouth daily., Until Discontinued, Historical Med        Today   VITAL SIGNS:  Blood pressure 116/72, pulse 96, temperature 98.1 F (36.7 C), temperature source Oral, resp. rate 16, height  (1.549 m), weight 62.778 kg (138 lb 6.4 oz), last menstrual period 12/10/2015, SpO2 95 %, peak flow 200 L/min.  I/O:  No intake or output data in the 24 hours ending 01/04/16 1559  PHYSICAL EXAMINATION:  Physical Exam  GENERAL:  25 y.o.-year-old patient lying in the bed with no acute distress.  LUNGS: Normal breath sounds bilaterally, no wheezing, rales,rhonchi or crepitation. No use of accessory muscles of respiration.  CARDIOVASCULAR: S1, S2 normal. No murmurs, rubs, or gallops.  ABDOMEN: Soft, non-tender, non-distended. Bowel sounds present. No organomegaly or mass.  NEUROLOGIC: Moves all 4 extremities. PSYCHIATRIC: The patient is alert and oriented x 3.  SKIN: No obvious rash, lesion,  or ulcer.   DATA REVIEW:   CBC  Recent Labs Lab 01/01/16 0438  WBC 10.0  HGB 13.1  HCT 37.3  PLT 177    Chemistries   Recent Labs Lab 01/01/16 0241  01/02/16 0441  NA 139  < > 138  K 2.8*  < > 3.8  CL 110  < > 110  CO2 14*  < > 22  GLUCOSE 147*  < > 150*  BUN 5*  < > 6  CREATININE 0.64  < > 0.50  CALCIUM 7.5*  < >  8.2*  MG  --   < > 2.6*  AST 29  --   --   ALT 17  --   --   ALKPHOS 80  --   --   BILITOT 0.4  --   --   < > = values in this interval not displayed.  Cardiac Enzymes No results for input(s): TROPONINI in the last 168 hours.  Microbiology Results  Results for orders placed or performed during the hospital encounter of 12/31/15  Rapid Influenza A&B Antigens ([facility] only)     Status: None   Collection Time: 12/31/15 10:34 PM  Result Value Ref Range Status   Influenza A [facility]) NOT DETECTED  Final   Influenza B ([facility]) NOT DETECTED  Final  Blood culture (routine x 2)     Status: None (Preliminary result)   Collection Time: 01/01/16  1:22 AM  Result Value Ref Range Status   Specimen Description BLOOD RIGHT FOREARM  Final   Special Requests BOTTLES DRAWN AEROBIC AND ANAEROBIC 1CCAERO,1CCANA  Final   Culture NO GROWTH 2 DAYS  Final   Report Status PENDING  Incomplete  Blood culture (routine x 2)     Status: None (Preliminary result)   Collection Time: 01/01/16  1:22 AM  Result Value Ref Range Status   Specimen Description BLOOD LEFT ASSIST CONTROL  Final   Special Requests BOTTLES DRAWN AEROBIC AND ANAEROBIC 2CCAERO,2CCANA  Final   Culture NO GROWTH 2 DAYS  Final   Report Status PENDING  Incomplete  MRSA PCR Screening     Status: None   Collection Time: 01/01/16  4:11 AM  Result Value Ref Range Status   MRSA by PCR NEGATIVE NEGATIVE Final    Comment:        The GeneXpert MRSA Assay (FDA approved for NASAL specimens only), is one component of a comprehensive MRSA colonization surveillance program. It is not intended to diagnose MRSA infection nor to guide or monitor treatment for MRSA infections.     RADIOLOGY:  No results found.  Follow up with PCP in 1 week.  Management plans discussed with the patient, family and they are in agreement.  CODE STATUS:  Code Status History    Date Active Date Inactive Code Status Order ID Comments User Context   01/01/2016  4:29 AM  01/02/2016  5:57 PM Full Code [id number]  Oralia Manis, MD Inpatient      TOTAL TIME TAKING CARE OF THIS PATIENT ON DAY OF DISCHARGE: more than 30 minutes.   Milagros Loll R M.D on 01/04/2016 at 3:59 PM  Between 7am to 6pm - Pager - [phone]  After 6pm go to [email or web address] - password [email or web address] [facility]  [facility]  Office  [phone]  CC: Primary care physician; No primary care provider on file.  Note: This dictation was prepared with Dragon dictation along with smaller phrase technology. Any transcriptional errors that result from this process are unintentional.

## 2016-01-06 LAB — CULTURE, BLOOD (ROUTINE X 2)
Culture: NO GROWTH
Culture: NO GROWTH

## 2016-02-22 ENCOUNTER — Emergency Department
Admission: EM | Admit: 2016-02-22 | Discharge: 2016-02-22 | Disposition: A | Discharge status: Home / Self Care | Payer: BLUE CROSS/BLUE SHIELD | Attending: Emergency Medicine | Admitting: Emergency Medicine

## 2016-02-22 ENCOUNTER — Encounter: Payer: Self-pay | Admitting: Emergency Medicine

## 2016-02-22 DIAGNOSIS — F329 Major depressive disorder, single episode, unspecified: Secondary | ICD-10-CM | POA: Insufficient documentation

## 2016-02-22 DIAGNOSIS — K920 Hematemesis: Secondary | ICD-10-CM

## 2016-02-22 DIAGNOSIS — J45909 Unspecified asthma, uncomplicated: Secondary | ICD-10-CM | POA: Insufficient documentation

## 2016-02-22 LAB — URINALYSIS COMPLETE WITH MICROSCOPIC (ARMC ONLY)
BACTERIA UA: NONE SEEN
BILIRUBIN URINE: NEGATIVE
Glucose, UA: NEGATIVE mg/dL
LEUKOCYTES UA: NEGATIVE
NITRITE: NEGATIVE
PH: 7 (ref 5.0–8.0)
Protein, ur: NEGATIVE mg/dL
SPECIFIC GRAVITY, URINE: 1.016 (ref 1.005–1.030)

## 2016-02-22 LAB — COMPREHENSIVE METABOLIC PANEL
ALBUMIN: 4.1 g/dL (ref 3.5–5.0)
ALT: 21 U/L (ref 14–54)
AST: 30 U/L (ref 15–41)
Alkaline Phosphatase: 81 U/L (ref 38–126)
Anion gap: 9 (ref 5–15)
BUN: 9 mg/dL (ref 6–20)
CO2: 24 mmol/L (ref 22–32)
CREATININE: 0.72 mg/dL (ref 0.44–1.00)
Calcium: 9.2 mg/dL (ref 8.9–10.3)
Chloride: 105 mmol/L (ref 101–111)
GFR calc Af Amer: >60 mL/min (ref >60)
GFR calc non Af Amer: >60 mL/min (ref >60)
Glucose, Bld: 138 mg/dL — ABNORMAL HIGH (ref 65–99)
Potassium: 3.8 mmol/L (ref 3.5–5.1)
SODIUM: 138 mmol/L (ref 135–145)
Total Bilirubin: 1.8 mg/dL — ABNORMAL HIGH (ref 0.3–1.2)
Total Protein: 7.1 g/dL (ref 6.5–8.1)

## 2016-02-22 LAB — CBC WITH DIFFERENTIAL/PLATELET
BASOS ABS: 0 10*3/uL (ref 0–0.1)
BASOS PCT: 1 %
EOS ABS: 0.2 10*3/uL (ref 0–0.7)
EOS PCT: 2 %
HCT: 42 % (ref 35.0–47.0)
Hemoglobin: 14.3 g/dL (ref 12.0–16.0)
LYMPHS PCT: 16 %
Lymphs Abs: 1.2 10*3/uL (ref 1.0–3.6)
MCH: 32.9 pg (ref 26.0–34.0)
MCHC: 34 g/dL (ref 32.0–36.0)
MCV: 96.7 fL (ref 80.0–100.0)
Monocytes Absolute: 0.6 10*3/uL (ref 0.2–0.9)
Monocytes Relative: 8 %
Neutro Abs: 5.5 10*3/uL (ref 1.4–6.5)
Neutrophils Relative %: 73 %
PLATELETS: 239 10*3/uL (ref 150–440)
RBC: 4.35 MIL/uL (ref 3.80–5.20)
RDW: 13.9 % (ref 11.5–14.5)
WBC: 7.5 10*3/uL (ref 3.6–11.0)

## 2016-02-22 LAB — POCT PREGNANCY, URINE: PREG TEST UR: NEGATIVE

## 2016-02-22 LAB — PROTIME-INR
INR: 1.06
Prothrombin Time: 14 seconds (ref 11.4–15.0)

## 2016-02-22 MED ORDER — FAMOTIDINE IN NACL 20-0.9 MG/50ML-% IV SOLN
20.0000 mg | Freq: Once | INTRAVENOUS | Status: AC
  Administered 2016-02-22: 20 mg via INTRAVENOUS
  Filled 2016-02-22: qty 50

## 2016-02-22 MED ORDER — FAMOTIDINE 20 MG PO TABS: 20.0000 mg | ORAL_TABLET | Freq: Two times a day (BID) | ORAL | Status: DC

## 2016-02-22 MED ORDER — SODIUM CHLORIDE 0.9 % IV SOLN
Freq: Once | INTRAVENOUS | Status: AC
  Administered 2016-02-22: 18:00:00 via INTRAVENOUS

## 2016-02-22 MED ORDER — ONDANSETRON HCL 4 MG/2ML IJ SOLN
4.0000 mg | Freq: Once | INTRAMUSCULAR | Status: AC
  Administered 2016-02-22: 4 mg via INTRAVENOUS
  Filled 2016-02-22: qty 2

## 2016-02-22 MED ORDER — ONDANSETRON HCL 4 MG PO TABS: 4.0000 mg | ORAL_TABLET | Freq: Every day | ORAL | Status: DC | PRN

## 2016-02-22 NOTE — ED Provider Notes (Signed)
[facility] Emergency Department Provider Note     Time seen: ----------------------------------------- 5:15 PM on 02/22/2016 -----------------------------------------    I have reviewed the triage vital signs and the nursing notes.   HISTORY  Chief Complaint Hematemesis    HPI Leslie Price is a 25 y.o. female who presents ER with nausea and bloody vomiting for 2 days. Patient states she has had a consistent amount of blood in each vomiting episode. She denies any abdominal pain, previously had acid reflux issues. She denies recent epigastric pain, she's had decreased appetite and some chills but no diarrhea. She does not think she could be pregnant, denies recent heavy alcohol use.   Past Medical History  Diagnosis Date  . Asthma   . Depression     Patient Active Problem List   Diagnosis Date Noted  . Acute bronchitis 01/02/2016  . Depression 01/01/2016  . Lactic acidosis 01/01/2016    Past Surgical History  Procedure Laterality Date  . No past surgeries      Allergies Review of patient's allergies indicates no known allergies.  Social History Social History  Substance Use Topics  . Smoking status: Never Smoker   . Smokeless tobacco: None  . Alcohol Use: 0.0 oz/week    0 Standard drinks or equivalent per week     Comment: occasional    Review of Systems Constitutional: Negative for fever. Eyes: Negative for visual changes. ENT: Negative for sore throat. Cardiovascular: Negative for chest pain. Respiratory: Negative for shortness of breath. Gastrointestinal: Negative for abdominal pain, positive for hematemesis Genitourinary: Negative for dysuria. Musculoskeletal: Negative for back pain. Skin: Negative for rash. Neurological: Negative for headaches, positive for weakness  10-point ROS otherwise negative.  ____________________________________________   PHYSICAL EXAM:  VITAL SIGNS: ED Triage Vitals  Enc Vitals Group      BP 02/22/16 1649 138/87 mmHg     Pulse Rate 02/22/16 1649 93     Resp 02/22/16 1649 97     Temp 02/22/16 1649 98.1 F (36.7 C)     Temp Source 02/22/16 1649 Oral     SpO2 02/22/16 1649 98 %     Weight 02/22/16 1649 136 lb (61.689 kg)     Height 02/22/16 1649 5\' 1"  (1.549 m)     Head Cir --      Peak Flow --      Pain Score 02/22/16 1649 5     Pain Loc --      Pain Edu? --      Excl. in GC? --     Constitutional: Alert and oriented. Well appearing and in no distress. Eyes: Conjunctivae are normal. PERRL. Normal extraocular movements. ENT   Head: Normocephalic and atraumatic.   Nose: No congestion/rhinnorhea.   Mouth/Throat: Mucous membranes are moist.   Neck: No stridor. Cardiovascular: Normal rate, regular rhythm. No murmurs, rubs, or gallops. Respiratory: Normal respiratory effort without tachypnea nor retractions. Breath sounds are clear and equal bilaterally. No wheezes/rales/rhonchi. Gastrointestinal: Soft and nontender. Normal bowel sounds Musculoskeletal: Nontender with normal range of motion in all extremities. No lower extremity tenderness nor edema. Neurologic:  Normal speech and language. No gross focal neurologic deficits are appreciated.  Skin:  Skin is warm, dry and intact. No rash noted. Psychiatric: Mood and affect are normal. Speech and behavior are normal.  ____________________________________________  ED COURSE:  Pertinent labs & imaging results that were available during my care of the patient were reviewed by me and considered in my medical decision making (see  chart for details). Patient looks well, will check basic labs, give IV fluids and antacids. ____________________________________________    LABS (pertinent positives/negatives)  Labs Reviewed  COMPREHENSIVE METABOLIC PANEL - Abnormal; Notable for the following:    Glucose, Bld 138 (*)    Total Bilirubin 1.8 (*)    All other components within normal limits  URINALYSIS  COMPLETEWITH MICROSCOPIC ([facility] ONLY) - Abnormal; Notable for the following:    Color, Urine YELLOW (*)    APPearance CLEAR (*)    Ketones, ur 1+ (*)    Hgb urine dipstick 3+ (*)    Squamous Epithelial / LPF 0-5 (*)    All other components within normal limits  CBC WITH DIFFERENTIAL/PLATELET  PROTIME-INR  POC URINE PREG, ED  POCT PREGNANCY, URINE   ___________________________________________  FINAL ASSESSMENT AND PLAN  Hematemesis  Plan: Patient with labs as dictated above. Patient's labs are unremarkable, she has tolerated liquids here without any problem. She'll be discharged with Zofran and Pepcid to start taking daily. She is referred to GI for outpatient follow-up.   Emily Filbert, MD   Emily Filbert, MD 02/22/16 [phone]

## 2016-02-22 NOTE — ED Notes (Signed)
Pt discharged home after verbalizing understanding of discharge instructions; nad noted. 

## 2016-02-22 NOTE — Discharge Instructions (Signed)
Hematemesis Hematemesis is when you vomit blood. It is a sign of bleeding in the upper part of your digestive tract. This is also called your gastrointestinal (GI) tract. Your upper GI tract includes your mouth, throat, esophagus, stomach, and the first part of your small intestine (duodenum).  Hematemesis is usually caused by bleeding from your esophagus or stomach. You may suddenly vomit bright red blood. You might also vomit old blood. It may look like coffee grounds. You may also have other symptoms, such as:  Stomach pain.  Heartburn.  Black and tarry stool.  HOME CARE INSTRUCTIONS  Watch your hematemesis for any changes. The following actions may help to lessen any discomfort you are feeling:  Take medicines only as directed by your health care provider. Do not take aspirin, ibuprofen, or any other anti-inflammatory medicine without approval from your health care provider.  Rest as needed.  Drink small sips of clear liquids often, as long as you can keep them down. Try to drink enough fluids to keep your urine clear or pale yellow.  Do not drink alcohol.  Do not use any tobacco products, including cigarettes, chewing tobacco, or electronic cigarettes. If you need help quitting, ask your health care provider.  Keep all follow-up visits as directed by your health care provider. This is important. SEEK MEDICAL CARE IF:   The vomiting of blood worsens, or begins again after it has stopped.  You have persistent stomach pain.  You have nausea, indigestion, or heartburn.  You feel weak or dizzy. SEEK IMMEDIATE MEDICAL CARE IF:   You faint or feel extremely weak.  You have a rapid heartbeat.  You are urinating less than normal or not at all.  You have persistent vomiting.  You vomit large amounts of bloody or dark material.  You vomit bright red blood.  You pass large, dark, or bloody stools.  You have chest pain or trouble breathing.   This information is not  intended to replace advice given to you by your health care provider. Make sure you discuss any questions you have with your health care provider.   Document Released: 11/28/2004 Document Revised: 11/11/2014 Document Reviewed: 06/15/2014 Elsevier Interactive Patient Education 2016 Elsevier Inc.  Nausea and Vomiting Nausea is a sick feeling that often comes before throwing up (vomiting). Vomiting is a reflex where stomach contents come out of your mouth. Vomiting can cause severe loss of body fluids (dehydration). Children and elderly adults can become dehydrated quickly, especially if they also have diarrhea. Nausea and vomiting are symptoms of a condition or disease. It is important to find the cause of your symptoms. CAUSES   Direct irritation of the stomach lining. This irritation can result from increased acid production (gastroesophageal reflux disease), infection, food poisoning, taking certain medicines (such as nonsteroidal anti-inflammatory drugs), alcohol use, or tobacco use.  Signals from the brain.These signals could be caused by a headache, heat exposure, an inner ear disturbance, increased pressure in the brain from injury, infection, a tumor, or a concussion, pain, emotional stimulus, or metabolic problems.  An obstruction in the gastrointestinal tract (bowel obstruction).  Illnesses such as diabetes, hepatitis, gallbladder problems, appendicitis, kidney problems, cancer, sepsis, atypical symptoms of a heart attack, or eating disorders.  Medical treatments such as chemotherapy and radiation.  Receiving medicine that makes you sleep (general anesthetic) during surgery. DIAGNOSIS Your caregiver may ask for tests to be done if the problems do not improve after a few days. Tests may also be done if symptoms  are severe or if the reason for the nausea and vomiting is not clear. Tests may include:  Urine tests.  Blood tests.  Stool tests.  Cultures (to look for evidence of  infection).  X-rays or other imaging studies. Test results can help your caregiver make decisions about treatment or the need for additional tests. TREATMENT You need to stay well hydrated. Drink frequently but in small amounts.You may wish to drink water, sports drinks, clear broth, or eat frozen ice pops or gelatin dessert to help stay hydrated.When you eat, eating slowly may help prevent nausea.There are also some antinausea medicines that may help prevent nausea. HOME CARE INSTRUCTIONS   Take all medicine as directed by your caregiver.  If you do not have an appetite, do not force yourself to eat. However, you must continue to drink fluids.  If you have an appetite, eat a normal diet unless your caregiver tells you differently.  Eat a variety of complex carbohydrates (rice, wheat, potatoes, bread), lean meats, yogurt, fruits, and vegetables.  Avoid high-fat foods because they are more difficult to digest.  Drink enough water and fluids to keep your urine clear or pale yellow.  If you are dehydrated, ask your caregiver for specific rehydration instructions. Signs of dehydration may include:  Severe thirst.  Dry lips and mouth.  Dizziness.  Dark urine.  Decreasing urine frequency and amount.  Confusion.  Rapid breathing or pulse. SEEK IMMEDIATE MEDICAL CARE IF:   You have blood or brown flecks (like coffee grounds) in your vomit.  You have black or bloody stools.  You have a severe headache or stiff neck.  You are confused.  You have severe abdominal pain.  You have chest pain or trouble breathing.  You do not urinate at least once every 8 hours.  You develop cold or clammy skin.  You continue to vomit for longer than 24 to 48 hours.  You have a fever. MAKE SURE YOU:   Understand these instructions.  Will watch your condition.  Will get help right away if you are not doing well or get worse.   This information is not intended to replace advice  given to you by your health care provider. Make sure you discuss any questions you have with your health care provider.   Document Released: 10/21/2005 Document Revised: 01/13/2012 Document Reviewed: 03/20/2011 Elsevier Interactive Patient Education [employer]

## 2016-02-22 NOTE — ED Notes (Signed)
Pt presents to the ED with complaints of nausea and vomiting blood for two days. Pt denies filling the toilet basin with blood but states she she sees bits of it at a time. Pt reports feeling weak and dizzy. Pt denies diarrhea. Pt states she has had chills. Pt reports decreased appetit.

## 2016-02-22 NOTE — ED Notes (Signed)
Pt reports that she has been vomiting for several days and that she has had blood in her vomit. She believes that half of the volume is blood, and she feels like the volume of emesis is large. Pt has been unable to keep down food. Pt alert & oriented with NAD noted.

## 2016-07-05 ENCOUNTER — Encounter: Payer: Self-pay | Admitting: Emergency Medicine

## 2016-07-05 ENCOUNTER — Emergency Department
Admission: EM | Admit: 2016-07-05 | Discharge: 2016-07-06 | Disposition: A | Discharge status: Home / Self Care | Payer: Self-pay | Attending: Emergency Medicine | Admitting: Emergency Medicine

## 2016-07-05 DIAGNOSIS — F1010 Alcohol abuse, uncomplicated: Secondary | ICD-10-CM | POA: Insufficient documentation

## 2016-07-05 DIAGNOSIS — J45909 Unspecified asthma, uncomplicated: Secondary | ICD-10-CM | POA: Insufficient documentation

## 2016-07-05 DIAGNOSIS — Z5181 Encounter for therapeutic drug level monitoring: Secondary | ICD-10-CM | POA: Insufficient documentation

## 2016-07-05 DIAGNOSIS — F329 Major depressive disorder, single episode, unspecified: Secondary | ICD-10-CM | POA: Insufficient documentation

## 2016-07-05 DIAGNOSIS — R45851 Suicidal ideations: Secondary | ICD-10-CM

## 2016-07-05 DIAGNOSIS — F32A Depression, unspecified: Secondary | ICD-10-CM

## 2016-07-05 LAB — COMPREHENSIVE METABOLIC PANEL
ALT: 22 U/L (ref 14–54)
ANION GAP: 11 (ref 5–15)
AST: 31 U/L (ref 15–41)
Albumin: 4.1 g/dL (ref 3.5–5.0)
Alkaline Phosphatase: 65 U/L (ref 38–126)
BUN: <5 mg/dL — ABNORMAL LOW (ref 6–20)
CHLORIDE: 110 mmol/L (ref 101–111)
CO2: 20 mmol/L — AB (ref 22–32)
Calcium: 8.7 mg/dL — ABNORMAL LOW (ref 8.9–10.3)
Creatinine, Ser: 0.62 mg/dL (ref 0.44–1.00)
Glucose, Bld: 97 mg/dL (ref 65–99)
Potassium: 3.3 mmol/L — ABNORMAL LOW (ref 3.5–5.1)
SODIUM: 141 mmol/L (ref 135–145)
Total Bilirubin: 1.1 mg/dL (ref 0.3–1.2)
Total Protein: 7.5 g/dL (ref 6.5–8.1)

## 2016-07-05 LAB — URINE DRUG SCREEN, QUALITATIVE (ARMC ONLY)
Amphetamines, Ur Screen: NOT DETECTED
BARBITURATES, UR SCREEN: NOT DETECTED
Benzodiazepine, Ur Scrn: NOT DETECTED
CANNABINOID 50 NG, UR ~~LOC~~: NOT DETECTED
COCAINE METABOLITE, UR ~~LOC~~: POSITIVE — AB
MDMA (ECSTASY) UR SCREEN: NOT DETECTED
METHADONE SCREEN, URINE: NOT DETECTED
Opiate, Ur Screen: NOT DETECTED
Phencyclidine (PCP) Ur S: NOT DETECTED
TRICYCLIC, UR SCREEN: NOT DETECTED

## 2016-07-05 LAB — CBC
HCT: 41.2 % (ref 35.0–47.0)
HEMOGLOBIN: 14.2 g/dL (ref 12.0–16.0)
MCH: 33.1 pg (ref 26.0–34.0)
MCHC: 34.5 g/dL (ref 32.0–36.0)
MCV: 96 fL (ref 80.0–100.0)
PLATELETS: 293 10*3/uL (ref 150–440)
RBC: 4.29 MIL/uL (ref 3.80–5.20)
RDW: 12.5 % (ref 11.5–14.5)
WBC: 9.3 10*3/uL (ref 3.6–11.0)

## 2016-07-05 LAB — ACETAMINOPHEN LEVEL

## 2016-07-05 LAB — SALICYLATE LEVEL

## 2016-07-05 LAB — ETHANOL: ALCOHOL ETHYL (B): 329 mg/dL — AB (ref <5)

## 2016-07-05 LAB — POCT PREGNANCY, URINE: PREG TEST UR: NEGATIVE

## 2016-07-05 MED ORDER — SODIUM CHLORIDE 0.9 % IV BOLUS (SEPSIS)
1000.0000 mL | Freq: Once | INTRAVENOUS | Status: AC
  Administered 2016-07-05: 1000 mL via INTRAVENOUS

## 2016-07-05 NOTE — ED Notes (Signed)

## 2016-07-05 NOTE — ED Notes (Signed)
Parent's contact info:  Stepdad [phone] : Mother [phone]

## 2016-07-05 NOTE — ED Triage Notes (Signed)
Pt states has felt depressed for "a long time". Pt states she has been thinking about suicide. Pt brought by [place] sherriff dept. Deputy states she is going to take IVC papers out on pt. Pt very tearful with minimal eye contact in triage.

## 2016-07-05 NOTE — ED Notes (Signed)
Pt to go to room 26, pt to have beth sit one on one with pt until cleared.

## 2016-07-05 NOTE — ED Notes (Addendum)
Gave patient phone to talk to her parents and she gave me permission to give her parents an update of what to expect during her course of treatment here.  After doing that I gave her the phone back to speak with her parents.

## 2016-07-05 NOTE — ED Provider Notes (Signed)
[facility] Emergency Department Provider Note   ____________________________________________   First MD Initiated Contact with Patient 07/05/16 2322     (approximate)  I have reviewed the triage vital signs and the nursing notes.   HISTORY  Chief Complaint Psychiatric Evaluation    HPI Leslie Price is a 25 y.o. female who comes into the hospital today with depression and suicidal thoughts. She reports that she has had these thoughts for as long as she can remember. She reports that it is not one singular thing with a bunch of different things going on that makes her feel this way. The patient reports that she was drinking tonight but she reports that drinking is not what makes her feel suicidal, she feels suicidal at this all the time. She reports that she had a couple of shots of vodka and a couple of beers. She reports that that was all that she drank. The patient also did some cocaine today but reports that she does not do cocaine often. The patient denies any hallucinations. She reports that she has been on medications for depression in the past and has been most recently on Prozac. The patient reports that she's been off Prozac for about 6 months. The patient is here for evaluation. She was involuntarily committed by the police.   Past Medical History:  Diagnosis Date  . Asthma   . Depression     Patient Active Problem List   Diagnosis Date Noted  . Acute bronchitis 01/02/2016  . Depression 01/01/2016  . Lactic acidosis 01/01/2016    Past Surgical History:  Procedure Laterality Date  . NO PAST SURGERIES      Prior to Admission medications   Not on File    Allergies Review of patient's allergies indicates no known allergies.  History reviewed. No pertinent family history.  Social History Social History  Substance Use Topics  . Smoking status: Never Smoker  . Smokeless tobacco: Never Used  . Alcohol use 0.0 oz/week     Comment:  occasional    Review of Systems Constitutional: No fever/chills Eyes: No visual changes. ENT: No sore throat. Cardiovascular: Denies chest pain. Respiratory: Denies shortness of breath. Gastrointestinal: No abdominal pain.  No nausea, no vomiting.  No diarrhea.  No constipation. Genitourinary: Negative for dysuria. Musculoskeletal: Negative for back pain. Skin: Negative for rash. Neurological: Negative for headaches, focal weakness or numbness. Psych: Depression and suicidal thoughts  10-point ROS otherwise negative.  ____________________________________________   PHYSICAL EXAM:  VITAL SIGNS: ED Triage Vitals [07/05/16 2143]  Enc Vitals Group     BP 137/81     Pulse Rate (!) 130     Resp (!) 22     Temp 98.4 F (36.9 C)     Temp Source Oral     SpO2 100 %     Weight 130 lb (59 kg)     Height 5\' 1"  (1.549 m)     Head Circumference      Peak Flow      Pain Score      Pain Loc      Pain Edu?      Excl. in GC?     Constitutional: Alert and oriented. Well appearing and in no acute distress. Eyes: Conjunctivae are normal. PERRL. EOMI. Head: Atraumatic. Nose: No congestion/rhinnorhea. Mouth/Throat: Mucous membranes are moist.  Oropharynx non-erythematous. Cardiovascular: Tachycardia regular rhythm. Grossly normal heart sounds.  Good peripheral circulation. Respiratory: Normal respiratory effort.  No retractions. Lungs CTAB. Gastrointestinal:  Soft and nontender. No distention. Positive bowel sounds Musculoskeletal: No lower extremity tenderness nor edema.   Neurologic:  Normal speech and language.  Skin:  Skin is warm, dry and intact.  Psychiatric: Tearful and crying, suicidal ideation  ____________________________________________   LABS (all labs ordered are listed, but only abnormal results are displayed)  Labs Reviewed  COMPREHENSIVE METABOLIC PANEL - Abnormal; Notable for the following:       Result Value   Potassium 3.3 (*)    CO2 20 (*)    BUN <5 (*)      Calcium 8.7 (*)    All other components within normal limits  ETHANOL - Abnormal; Notable for the following:    Alcohol, Ethyl (B) 329 (*)    All other components within normal limits  ACETAMINOPHEN LEVEL - Abnormal; Notable for the following:    Acetaminophen (Tylenol), Serum <10 (*)    All other components within normal limits  URINE DRUG SCREEN, QUALITATIVE ([facility] ONLY) - Abnormal; Notable for the following:    Cocaine Metabolite,Ur [place] POSITIVE (*)    All other components within normal limits  SALICYLATE LEVEL  CBC  POC URINE PREG, ED  POCT PREGNANCY, URINE   ____________________________________________  EKG  None ____________________________________________  RADIOLOGY  None ____________________________________________   PROCEDURES  Procedure(s) performed: None  Procedures  Critical Care performed: No  ____________________________________________   INITIAL IMPRESSION / ASSESSMENT AND PLAN / ED COURSE  Pertinent labs & imaging results that were available during my care of the patient were reviewed by me and considered in my medical decision making (see chart for details).  This is a 25 year old female who comes into the hospital today with suicidal thoughts. The patient was involuntarily committed by the police. She is intoxicated and has been using cocaine today. The patient is tachycardic and tearful. I will have the patient evaluated by TTS as well as psych.  Clinical Course     ____________________________________________   FINAL CLINICAL IMPRESSION(S) / ED DIAGNOSES  Final diagnoses:  Depression  Suicidal ideation      NEW MEDICATIONS STARTED DURING THIS VISIT:  New Prescriptions   No medications on file     Note:  This document was prepared using Dragon voice recognition software and may include unintentional dictation errors.    Rebecka ApleyAllison P Lucindy Borel, MD 07/06/16 [phone]

## 2016-07-05 NOTE — ED Notes (Signed)
TTS at bedside. 

## 2016-07-06 MED ORDER — SODIUM CHLORIDE 0.9 % IV BOLUS (SEPSIS)
1000.0000 mL | Freq: Once | INTRAVENOUS | Status: AC
  Administered 2016-07-06: 1000 mL via INTRAVENOUS

## 2016-07-06 NOTE — ED Notes (Signed)

## 2016-07-06 NOTE — ED Notes (Signed)
pts boyfriends mother picked her up

## 2016-07-06 NOTE — ED Notes (Signed)
[facility]

## 2016-07-06 NOTE — ED Notes (Signed)
BHU called, patient will be roomed in BHU1.

## 2016-07-06 NOTE — ED Provider Notes (Signed)
Patient's been cleared by Tele-psychiatry for discharge.   Emily FilbertJonathan E Markia Kyer, MD 07/06/16 [phone]

## 2016-07-06 NOTE — ED Notes (Signed)
Report was received from Piedad Climesavid W., RN; Pt. Verbalizes  Complaint of, "I don't want to be here."  Distress; tearfull ; verbalizes having S.I./Hi. Continue to monitor with 15 min. Monitoring.

## 2016-07-06 NOTE — ED Notes (Signed)
ED BHU PLACEMENT JUSTIFICATION Is the patient under IVC or is there intent for IVC: Yes.   Is the patient medically cleared: Yes.   Is there vacancy in the ED BHU: Yes.   Is the population mix appropriate for patient: Yes.   Is the patient awaiting placement in inpatient or outpatient setting: Yes.   Has the patient had a psychiatric consult: Yes.   Survey of unit performed for contraband, proper placement and condition of furniture, tampering with fixtures in bathroom, shower, and each patient room: Yes.   APPEARANCE/BEHAVIOR calm NEURO ASSESSMENT Orientation: place and person Hallucinations: No.None noted (Hallucinations) Speech: Normal Gait: normal RESPIRATORY ASSESSMENT Normal expansion.  Clear to auscultation.  No rales, rhonchi, or wheezing. CARDIOVASCULAR ASSESSMENT regular rate and rhythm, S1, S2 normal, no murmur, click, rub or gallop GASTROINTESTINAL ASSESSMENT soft, nontender, BS WNL, no r/g EXTREMITIES normal strength, tone, and muscle mass PLAN OF CARE Provide calm/safe environment. Vital signs assessed twice daily. ED BHU Assessment once each 12-hour shift. Collaborate with intake RN daily or as condition indicates. Assure the ED provider has rounded once each shift. Provide and encourage hygiene. Provide redirection as needed. Assess for escalating behavior; address immediately and inform ED provider.  Assess family dynamic and appropriateness for visitation as needed: Yes.   Educate the patient/family about BHU procedures/visitation: Yes.

## 2016-07-06 NOTE — ED Notes (Signed)

## 2016-07-06 NOTE — ED Notes (Signed)
Pt has called a ride

## 2016-07-06 NOTE — BH Assessment (Signed)
Assessment Note  Leslie SpitzLauren Price is an 25 y.o. female presenting to the ED under IVC, via [facility] with concerns of depression and suicidal thoughts with no plan. She reports that she has had these thoughts for as long as she can remember. She reports multiple stressors but did not elaborate. She reports doing 5 lines of cocaine and drinking a couple of shots of vodka and a couple of beers. She denies any visual/auditory  hallucinations. She reports that she has been on medications for depression in the past and has been most recently on Prozac.   Patient BAC 329.  UDS positive for cocaine.  Diagnosis: Major Depression  Past Medical History:  Past Medical History:  Diagnosis Date  . Asthma   . Depression     Past Surgical History:  Procedure Laterality Date  . NO PAST SURGERIES      Family History: History reviewed. No pertinent family history.  Social History:  reports that she has never smoked. She has never used smokeless tobacco. She reports that she drinks alcohol. She reports that she does not use drugs.  Additional Social History:  Alcohol / Drug Use History of alcohol / drug use?: Yes Longest period of sobriety (when/how long): can't remember Negative Consequences of Use: Personal relationships Substance #1 Name of Substance 1: Alcohol 1 - Age of First Use: 19 1 - Amount (size/oz): varies 1 - Frequency: varies 1 - Duration: weekly 1 - Last Use / Amount: 07/05/16 Substance #2 Name of Substance 2: cocaine 2 - Age of First Use: 20 2 - Amount (size/oz): 5 lines 2 - Frequency: varies 2 - Duration: varies 2 - Last Use / Amount: 07/05/16  CIWA: CIWA-Ar BP: 120/74 Pulse Rate: 89 COWS:    Allergies: No Known Allergies  Home Medications:  (Not in a hospital admission)  OB/GYN Status:  Patient's last menstrual period was 06/23/2016.  General Assessment Data [facility] Assessment: In system Is this a Tele or Face-to-Face Assessment?:  Face-to-Face Is this an Initial Assessment or a Re-assessment for this encounter?: Initial Assessment Marital status: Single Maiden name: n/a Is patient pregnant?: No Pregnancy Status: No Living Arrangements: Non-relatives/Friends Can pt return to current living arrangement?: Yes Admission Status: Voluntary Is patient capable of signing voluntary admission?: Yes Referral Source: Other Insurance type: None  Medical Screening Exam [facility] Walk-in ONLY) Medical Exam completed: Yes  Crisis Care Plan Living Arrangements: Non-relatives/Friends Legal Guardian: Other: (self) Name of Psychiatrist: none identified Name of Therapist: none identified  Education Status Is patient currently in school?: No Current Grade: n/a Highest grade of school patient has completed: n/a Name of school: n/a Contact person: n/a  Risk to self with the past 6 months Suicidal Ideation: Yes-Currently Present Has patient been a risk to self within the past 6 months prior to admission? : No Suicidal Intent: No Has patient had any suicidal intent within the past 6 months prior to admission? : No Is patient at risk for suicide?: No Suicidal Plan?: No Has patient had any suicidal plan within the past 6 months prior to admission? : No Access to Means: No What has been your use of drugs/alcohol within the last 12 months?: cocaine and alcohol Previous Attempts/Gestures: No How many times?: 0 Other Self Harm Risks: None identified Triggers for Past Attempts: None known Intentional Self Injurious Behavior: None Family Suicide History: No Recent stressful life event(s): Other (Comment) (Patient refuses to answer) Persecutory voices/beliefs?: No Depression: Yes Depression Symptoms: Feeling worthless/self pity, Guilt,  Tearfulness Substance abuse history and/or treatment for substance abuse?: Yes Suicide prevention information given to non-admitted patients: Not applicable  Risk to Others within the past 6  months Homicidal Ideation: No Does patient have any lifetime risk of violence toward others beyond the six months prior to admission? : No Thoughts of Harm to Others: No Current Homicidal Intent: No Current Homicidal Plan: No Access to Homicidal Means: No Identified Victim: None identified History of harm to others?: No Assessment of Violence: None Noted Violent Behavior Description: None identified Does patient have access to weapons?: No Criminal Charges Pending?: No Does patient have a court date: No Is patient on probation?: No  Psychosis Hallucinations: None noted Delusions: None noted  Mental Status Report Appearance/Hygiene: In scrubs Eye Contact: Poor Motor Activity: Freedom of movement Speech: Slow Level of Consciousness: Crying, Alert Mood: Depressed, Despair, Sad Affect: Appropriate to circumstance, Sad, Depressed Anxiety Level: Minimal Thought Processes: Relevant Judgement: Partial Orientation: Person, Place, Time, Situation Obsessive Compulsive Thoughts/Behaviors: None  Cognitive Functioning Concentration: Normal Memory: Recent Intact, Remote Intact IQ: Average Insight: Fair Impulse Control: Fair Sleep: Decreased Vegetative Symptoms: None  ADLScreening [facility] Assessment Services) Patient's cognitive ability adequate to safely complete daily activities?: Yes Patient able to express need for assistance with ADLs?: Yes Independently performs ADLs?: Yes (appropriate for developmental age)  Prior Inpatient Therapy Prior Inpatient Therapy: No Prior Therapy Dates: na Prior Therapy Facilty/Provider(s): na Reason for Treatment: na  Prior Outpatient Therapy Prior Outpatient Therapy: No Prior Therapy Dates: na Prior Therapy Facilty/Provider(s): na Reason for Treatment: na Does patient have an ACCT team?: No Does patient have Intensive In-House Services?  : No Does patient have [facility] services? : No Does patient have P4CC services?: No  ADL Screening  (condition at time of admission) Patient's cognitive ability adequate to safely complete daily activities?: Yes Patient able to express need for assistance with ADLs?: Yes Independently performs ADLs?: Yes (appropriate for developmental age)       Abuse/Neglect Assessment (Assessment to be complete while patient is alone) Physical Abuse: Denies Verbal Abuse: Denies Sexual Abuse: Denies Exploitation of patient/patient's resources: Denies Self-Neglect: Denies Values / Beliefs Cultural Requests During Hospitalization: None Spiritual Requests During Hospitalization: None Consults Spiritual Care Consult Needed: No Social Work Consult Needed: No      Additional Information 1:1 In Past 12 Months?: No CIRT Risk: No Elopement Risk: No Does patient have medical clearance?: Yes     Disposition:  Disposition Initial Assessment Completed for this Encounter: Yes Disposition of Patient: Other dispositions Other disposition(s): Other (Comment) (Pending Psych MD consult)  On Site Evaluation by:   Reviewed with Physician:    Artist [place] 07/06/2016 12:51 AM

## 2016-07-06 NOTE — ED Notes (Signed)
[facility], Lyla Sonarrie EDT, and ODS.

## 2016-07-06 NOTE — ED Notes (Signed)
Pt states that she had an argument with her boyfriend while intoxicated and [place] to [place] step [place] in  [place] that she felt suicidal so she called the police and had her brought to the hospital. Then she called her mother and them her mother came to the hospital in the lobby and was informed that visitng is not until 12 pm the patient states she does not want to live with her mother and wants to return to her house with her boyfriend,pt behavior is appropriate and she denies any suicidal thoughts

## 2017-10-03 IMAGING — CR DG CHEST 1V PORT
1 series · 1 of 1 positions shown · non-contrast
Comparison: None.

CLINICAL DATA: Acute onset of cough and wheezing. Initial
encounter.

EXAM:
PORTABLE CHEST 1 VIEW

[portable]
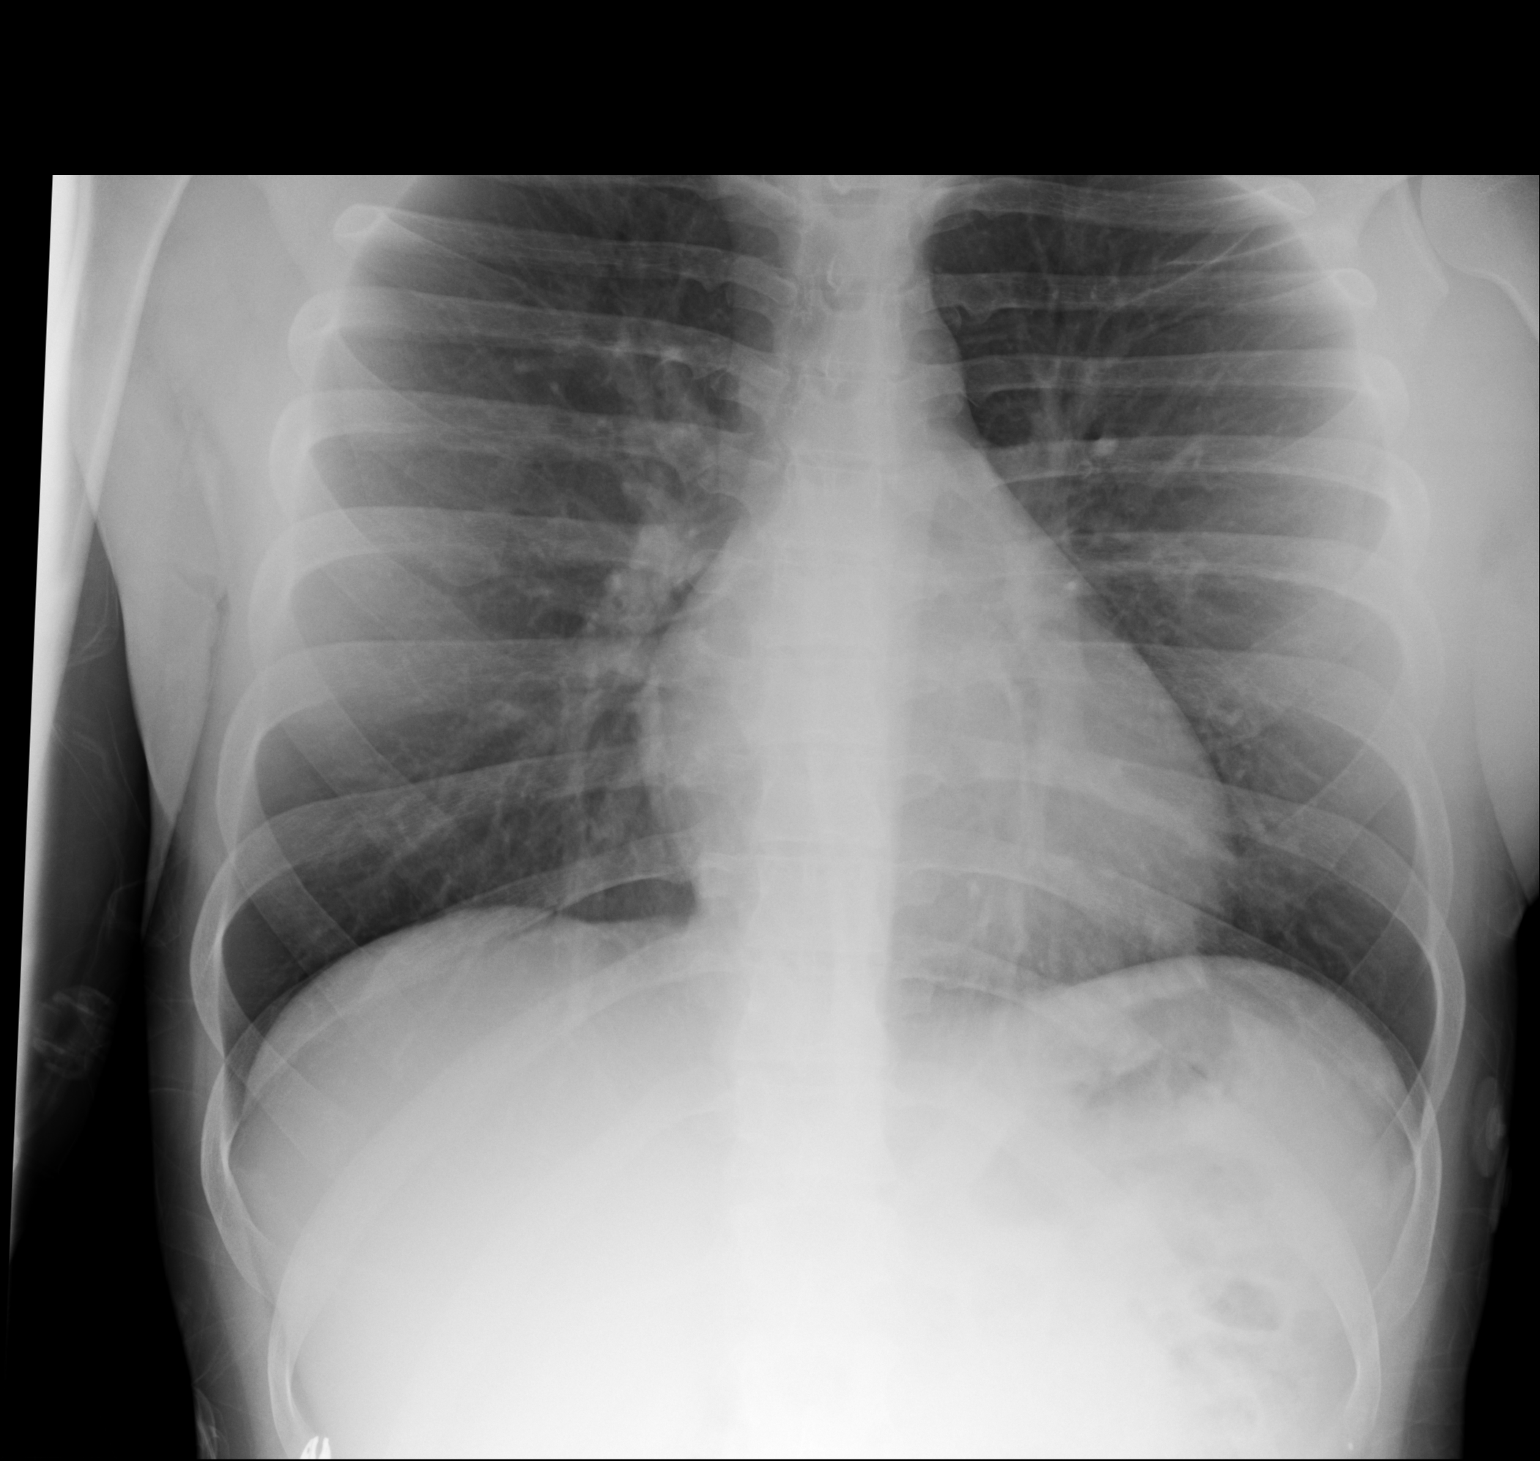

[1 of 1 positions shown; findings below may reference images not displayed]

FINDINGS: The lungs are well-aerated and clear. There is no evidence of focal
opacification, pleural effusion or pneumothorax.

The cardiomediastinal silhouette is within normal limits. No acute
osseous abnormalities are seen.
IMPRESSION: No acute cardiopulmonary process seen.
